# Patient Record
Sex: Male | Born: 1995 | Race: Black or African American | Hispanic: No | Marital: Single | Smoking: Current every day smoker
Health system: Southern US, Community
[De-identification: ages and names within clinical notes are randomized; demographics above are authoritative.]

---
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
## 2015-01-02 ENCOUNTER — Telehealth: Payer: Self-pay | Admitting: Family Medicine

## 2015-01-02 NOTE — Telephone Encounter (Signed)
wcc from 2014 printed and faxed. Joi Leyva,CMA

## 2015-01-02 NOTE — Telephone Encounter (Signed)
Mother called and needs us to [facility] Fax [phone]. jw

## 2016-03-16 ENCOUNTER — Emergency Department (HOSPITAL_COMMUNITY): Payer: No Typology Code available for payment source

## 2016-03-16 ENCOUNTER — Emergency Department (HOSPITAL_COMMUNITY)
Admission: EM | Admit: 2016-03-16 | Discharge: 2016-03-17 | Disposition: A | Discharge status: Home / Self Care | Payer: No Typology Code available for payment source | Attending: Emergency Medicine | Admitting: Emergency Medicine

## 2016-03-16 DIAGNOSIS — M25562 Pain in left knee: Secondary | ICD-10-CM | POA: Diagnosis not present

## 2016-03-16 DIAGNOSIS — Y92410 Unspecified street and highway as the place of occurrence of the external cause: Secondary | ICD-10-CM | POA: Insufficient documentation

## 2016-03-16 DIAGNOSIS — Y939 Activity, unspecified: Secondary | ICD-10-CM | POA: Diagnosis not present

## 2016-03-16 DIAGNOSIS — Y999 Unspecified external cause status: Secondary | ICD-10-CM | POA: Diagnosis not present

## 2016-03-16 NOTE — Discharge Instructions (Signed)
Take anti-inflammatory medicines (Ibuprofen) for the next week as needed for pain. Take this medicine with food. Return for worsening symptoms
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
## 2016-03-26 ENCOUNTER — Encounter (HOSPITAL_COMMUNITY): Payer: Self-pay | Admitting: Emergency Medicine

## 2016-03-26 ENCOUNTER — Ambulatory Visit (HOSPITAL_COMMUNITY)
Admission: EM | Admit: 2016-03-26 | Discharge: 2016-03-26 | Disposition: A | Discharge status: Home / Self Care | Payer: Self-pay | Attending: Family Medicine | Admitting: Family Medicine

## 2016-03-26 DIAGNOSIS — S8002XA Contusion of left knee, initial encounter: Secondary | ICD-10-CM

## 2016-03-26 MED ORDER — PREDNISONE 20 MG PO TABS: ORAL_TABLET | ORAL | 0 refills | Status: DC

## 2016-03-26 NOTE — Discharge Instructions (Signed)
.  You need to rest the knee today and this weekend, but return to work on Monday.
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
## 2016-03-30 ENCOUNTER — Encounter (HOSPITAL_COMMUNITY): Payer: Self-pay | Admitting: *Deleted

## 2016-03-30 ENCOUNTER — Ambulatory Visit (HOSPITAL_COMMUNITY)
Admission: EM | Admit: 2016-03-30 | Discharge: 2016-03-30 | Disposition: A | Discharge status: Home / Self Care | Payer: Self-pay | Attending: Family Medicine | Admitting: Family Medicine

## 2016-03-30 DIAGNOSIS — M25562 Pain in left knee: Secondary | ICD-10-CM

## 2016-03-30 MED ORDER — NAPROXEN 500 MG PO TABS: 500.0000 mg | ORAL_TABLET | Freq: Two times a day (BID) | ORAL | 0 refills | Status: DC
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
## 2016-04-01 ENCOUNTER — Ambulatory Visit (INDEPENDENT_AMBULATORY_CARE_PROVIDER_SITE_OTHER): Payer: Self-pay | Admitting: Physician Assistant

## 2016-04-01 DIAGNOSIS — M25562 Pain in left knee: Secondary | ICD-10-CM
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
## 2016-08-03 ENCOUNTER — Ambulatory Visit (HOSPITAL_COMMUNITY)
Admission: EM | Admit: 2016-08-03 | Discharge: 2016-08-03 | Disposition: A | Discharge status: Home / Self Care | Payer: Self-pay | Attending: Family Medicine | Admitting: Family Medicine

## 2016-08-03 ENCOUNTER — Encounter (HOSPITAL_COMMUNITY): Payer: Self-pay | Admitting: Emergency Medicine

## 2016-08-03 DIAGNOSIS — G43001 Migraine without aura, not intractable, with status migrainosus: Secondary | ICD-10-CM

## 2016-08-03 MED ORDER — BUTALBITAL-APAP-CAFFEINE 50-325-40 MG PO TABS: 1.0000 | ORAL_TABLET | Freq: Four times a day (QID) | ORAL | 0 refills | Status: AC | PRN
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
## 2016-08-03 NOTE — Discharge Instructions (Signed)
Drink plenty of fluids.  Return if headache worsens

## 2016-08-03 NOTE — ED Triage Notes (Signed)
[facility] a [facility].

## 2017-01-20 ENCOUNTER — Other Ambulatory Visit: Payer: Self-pay

## 2017-01-20 ENCOUNTER — Emergency Department (HOSPITAL_COMMUNITY)
Admission: EM | Admit: 2017-01-20 | Discharge: 2017-01-20 | Disposition: A | Discharge status: Home / Self Care | Payer: Medicaid Other | Attending: Emergency Medicine | Admitting: Emergency Medicine

## 2017-01-20 ENCOUNTER — Emergency Department (HOSPITAL_COMMUNITY): Payer: Medicaid Other

## 2017-01-20 ENCOUNTER — Encounter (HOSPITAL_COMMUNITY): Payer: Self-pay

## 2017-01-20 DIAGNOSIS — Y999 Unspecified external cause status: Secondary | ICD-10-CM | POA: Insufficient documentation

## 2017-01-20 DIAGNOSIS — Y9389 Activity, other specified: Secondary | ICD-10-CM | POA: Insufficient documentation

## 2017-01-20 DIAGNOSIS — W19XXXA Unspecified fall, initial encounter: Secondary | ICD-10-CM | POA: Insufficient documentation

## 2017-01-20 DIAGNOSIS — Y929 Unspecified place or not applicable: Secondary | ICD-10-CM | POA: Insufficient documentation

## 2017-01-20 DIAGNOSIS — F17210 Nicotine dependence, cigarettes, uncomplicated: Secondary | ICD-10-CM | POA: Insufficient documentation

## 2017-01-20 DIAGNOSIS — S6991XA Unspecified injury of right wrist, hand and finger(s), initial encounter: Secondary | ICD-10-CM | POA: Insufficient documentation
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
## 2017-01-20 NOTE — ED Triage Notes (Addendum)
Patient reports at approx 1500, he tripped and attempted to catch himself on the frame of a door. Patient reports the ring finger on his right hand got bent "the wrong way" and is currently "throbbing." Patient denies LOC or head injury. Patient able to move his right ring finger with full ROM. Patient reports taking 400mg  Ibuprofen for the pain at approx 1530.

## 2017-01-20 NOTE — Discharge Instructions (Signed)
We are splinting the finger for comfort and to keep it in good alignment. You may have injured the extensor tendon. It is important that you call Dr. Merrilee SeashoreKuzma's office tomorrow to schedule a follow up appointment for next week. Return here sooner for any problems.

## 2017-06-16 ENCOUNTER — Other Ambulatory Visit: Payer: Self-pay

## 2017-06-16 ENCOUNTER — Emergency Department (HOSPITAL_COMMUNITY): Payer: Worker's Compensation

## 2017-06-16 ENCOUNTER — Emergency Department (HOSPITAL_COMMUNITY)
Admission: EM | Admit: 2017-06-16 | Discharge: 2017-06-16 | Disposition: A | Discharge status: Home / Self Care | Payer: Worker's Compensation | Attending: Emergency Medicine | Admitting: Emergency Medicine

## 2017-06-16 ENCOUNTER — Encounter (HOSPITAL_COMMUNITY): Payer: Self-pay

## 2017-06-16 DIAGNOSIS — S6991XA Unspecified injury of right wrist, hand and finger(s), initial encounter: Secondary | ICD-10-CM | POA: Diagnosis present

## 2017-06-16 DIAGNOSIS — Y929 Unspecified place or not applicable: Secondary | ICD-10-CM | POA: Insufficient documentation

## 2017-06-16 DIAGNOSIS — F17210 Nicotine dependence, cigarettes, uncomplicated: Secondary | ICD-10-CM | POA: Insufficient documentation

## 2017-06-16 DIAGNOSIS — W208XXA Other cause of strike by thrown, projected or falling object, initial encounter: Secondary | ICD-10-CM | POA: Diagnosis not present

## 2017-06-16 DIAGNOSIS — Y990 Civilian activity done for income or pay: Secondary | ICD-10-CM | POA: Diagnosis not present

## 2017-06-16 DIAGNOSIS — S60221A Contusion of right hand, initial encounter: Secondary | ICD-10-CM | POA: Diagnosis not present

## 2017-06-16 DIAGNOSIS — Y9389 Activity, other specified: Secondary | ICD-10-CM | POA: Diagnosis not present

## 2017-06-16 MED ORDER — NAPROXEN 500 MG PO TABS: 500.0000 mg | ORAL_TABLET | Freq: Two times a day (BID) | ORAL | 0 refills | Status: AC

## 2017-06-16 MED ORDER — KETOROLAC TROMETHAMINE 30 MG/ML IJ SOLN
30.0000 mg | Freq: Once | INTRAMUSCULAR | Status: AC
  Administered 2017-06-16: 30 mg via INTRAMUSCULAR
  Filled 2017-06-16: qty 1
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
## 2017-06-16 NOTE — ED Triage Notes (Signed)
Patient presents with workmans compensation injury. Patient reports he was lifting an approx 200lb dresser with a colleague, and the dresser dropped and landed on his right hand. Patient reports numbness /tingling to right middle finger, pointer finger, and thumb, radiating down his right wrist, with no sensation of touch. Patient reports normal tingling to his left ring finger and pinky, but reports sensation to these fingers. Patient's capillary refill to all right handed fingers <3seconds. Palpable pulses to right radial wrist noted. Patient denies pain at this time, but cannot move his right handed fingers.

## 2017-06-28 ENCOUNTER — Other Ambulatory Visit: Payer: Self-pay | Admitting: Orthopedic Surgery

## 2017-06-28 DIAGNOSIS — S62001A Unspecified fracture of navicular [scaphoid] bone of right wrist, initial encounter for closed fracture: Secondary | ICD-10-CM

## 2017-12-23 ENCOUNTER — Other Ambulatory Visit: Payer: Self-pay

## 2017-12-23 ENCOUNTER — Encounter (HOSPITAL_COMMUNITY): Payer: Self-pay | Admitting: Emergency Medicine

## 2017-12-23 ENCOUNTER — Emergency Department (HOSPITAL_COMMUNITY)
Admission: EM | Admit: 2017-12-23 | Discharge: 2017-12-23 | Disposition: A | Discharge status: Home / Self Care | Payer: Worker's Compensation | Attending: Emergency Medicine | Admitting: Emergency Medicine

## 2017-12-23 DIAGNOSIS — M79605 Pain in left leg: Secondary | ICD-10-CM

## 2017-12-23 DIAGNOSIS — F17210 Nicotine dependence, cigarettes, uncomplicated: Secondary | ICD-10-CM | POA: Diagnosis not present

## 2017-12-23 DIAGNOSIS — M79662 Pain in left lower leg: Secondary | ICD-10-CM | POA: Diagnosis present

## 2017-12-23 NOTE — ED Notes (Signed)
ED Provider at bedside. 

## 2017-12-23 NOTE — ED Notes (Signed)
Ortho tech made aware of crutches order

## 2017-12-23 NOTE — Discharge Instructions (Signed)
Use the crutches so that you do not have to weight-bear on your left leg for the next 3 days.  Place an ice pack on the painful area 30 minutes at a time 4 times daily.  It is okay to take Tylenol for pain.  Call Dr. Luiz Blare if you have significant pain or difficulty walking by Monday, December 26, 2017
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
## 2017-12-23 NOTE — ED Notes (Signed)
Pt was able to get himself out of wheelchair and walk into room from hallway without any assistance and steady gait.

## 2017-12-23 NOTE — ED Triage Notes (Addendum)
Patient here from home with complaints of left leg pain from knee to ankle that started last night after moving furniture. Ambulatory.

## 2018-11-29 IMAGING — CR DG HAND COMPLETE 3+V*R*
3 series · 3 of 3 positions shown · non-contrast
Comparison: None.

CLINICAL DATA: Right ring finger pain after hand was bent backward
on a door jam.

EXAM:
RIGHT HAND - COMPLETE 3+ VIEW

[x hand pa right]
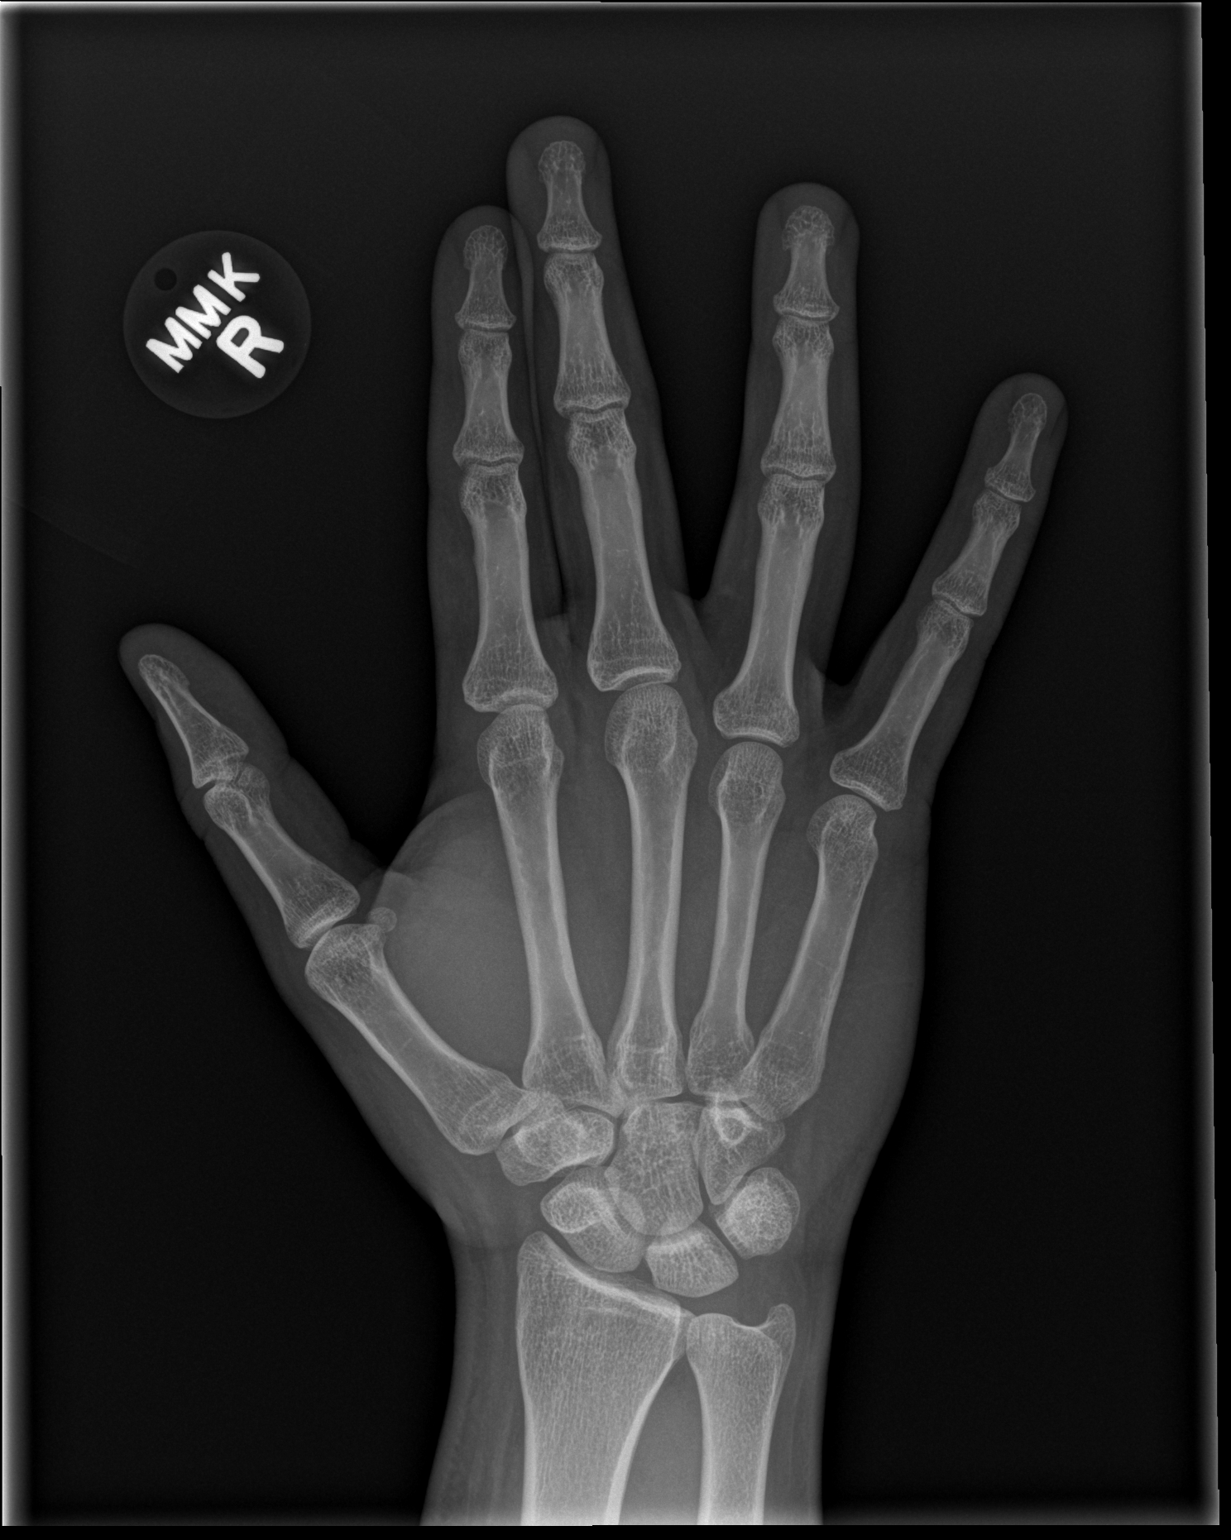

[x hand obl right]
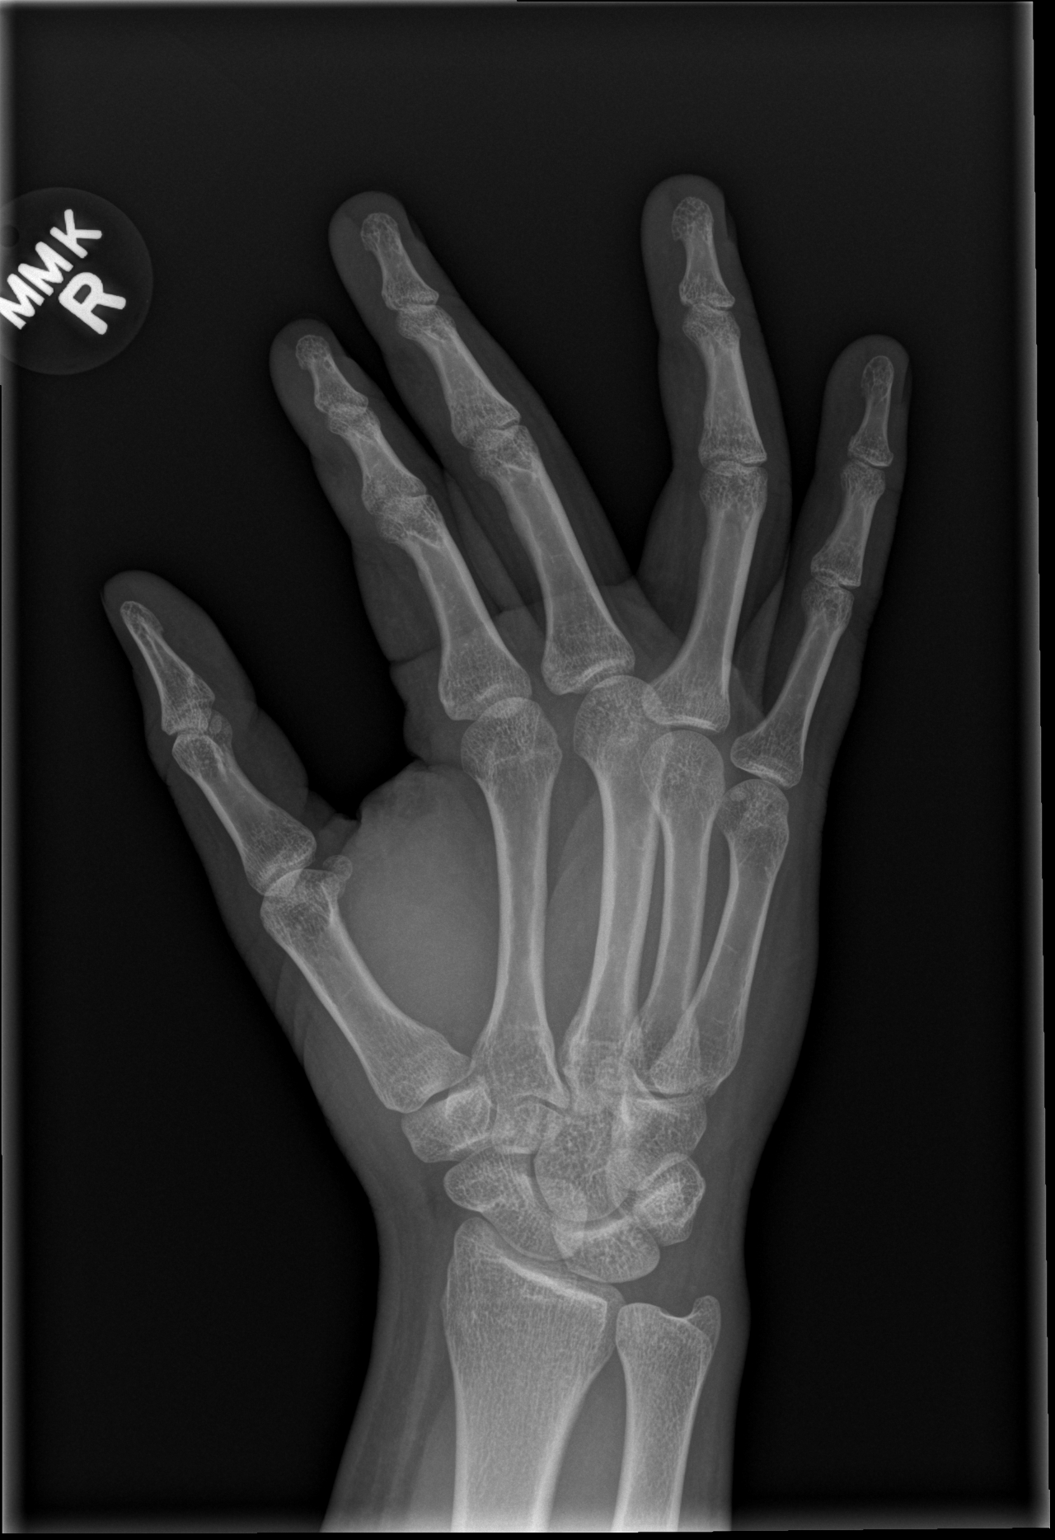

[x hand lat right]
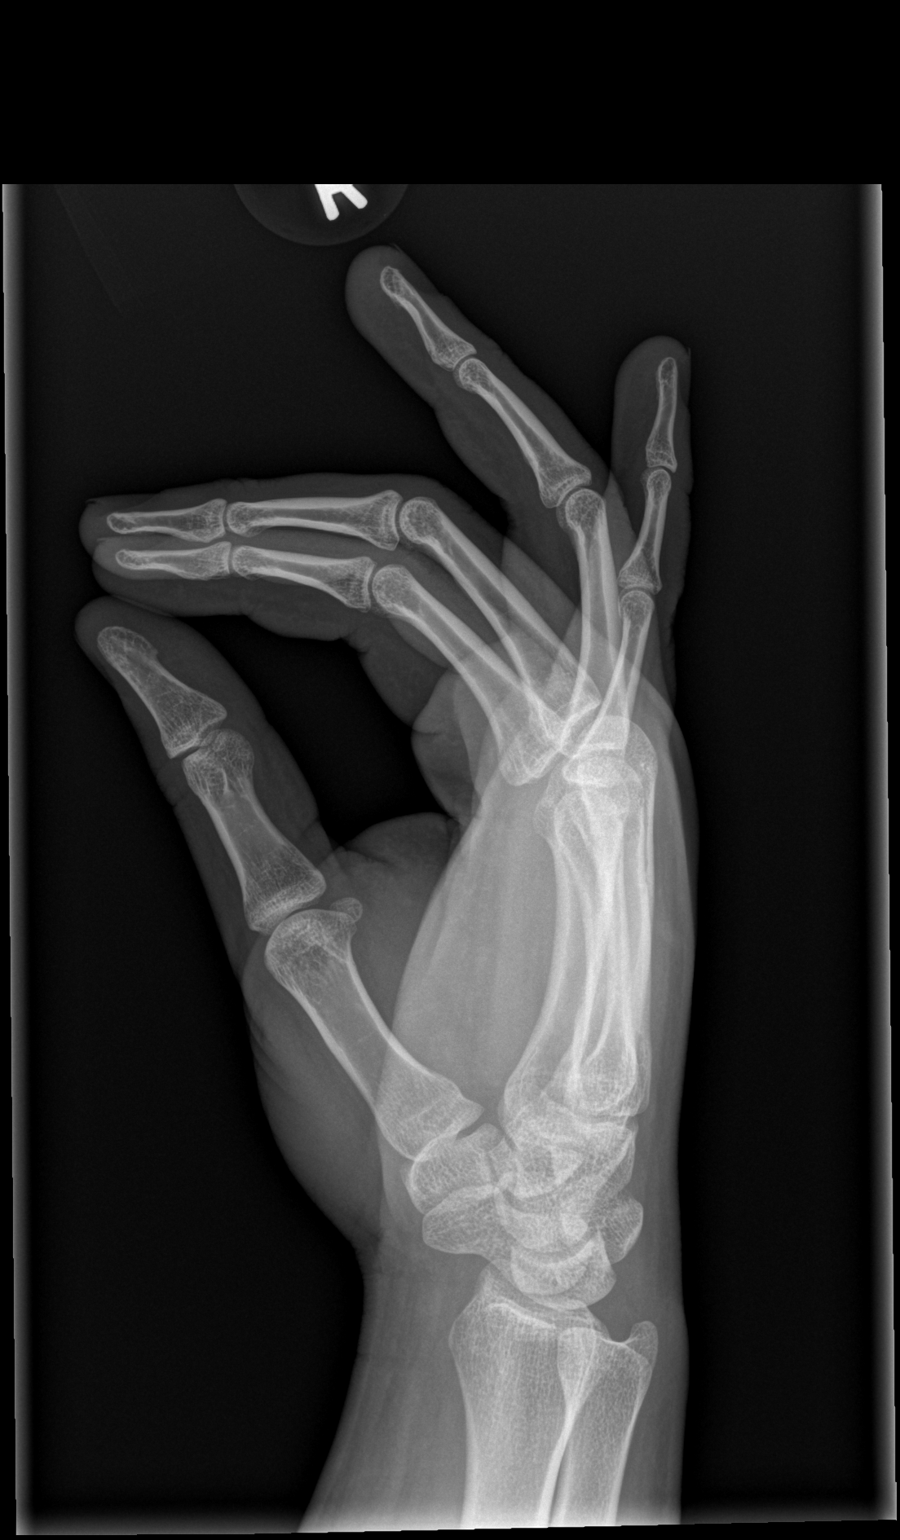

[3 of 3 positions shown; findings below may reference images not displayed]

FINDINGS: There is no evidence of fracture or dislocation. There is no
evidence of arthropathy or other focal bone abnormality. Soft
tissues are unremarkable.
IMPRESSION: No acute fracture dislocation of the right hand and wrist.

## 2019-04-25 IMAGING — CR DG WRIST COMPLETE 3+V*R*
4 series · 4 of 4 positions shown · non-contrast
Comparison: None.

CLINICAL DATA: Crush injury to right hand.

EXAM:
RIGHT HAND - COMPLETE 3+ VIEW; RIGHT WRIST - COMPLETE 3+ VIEW

[x wrist pa right]
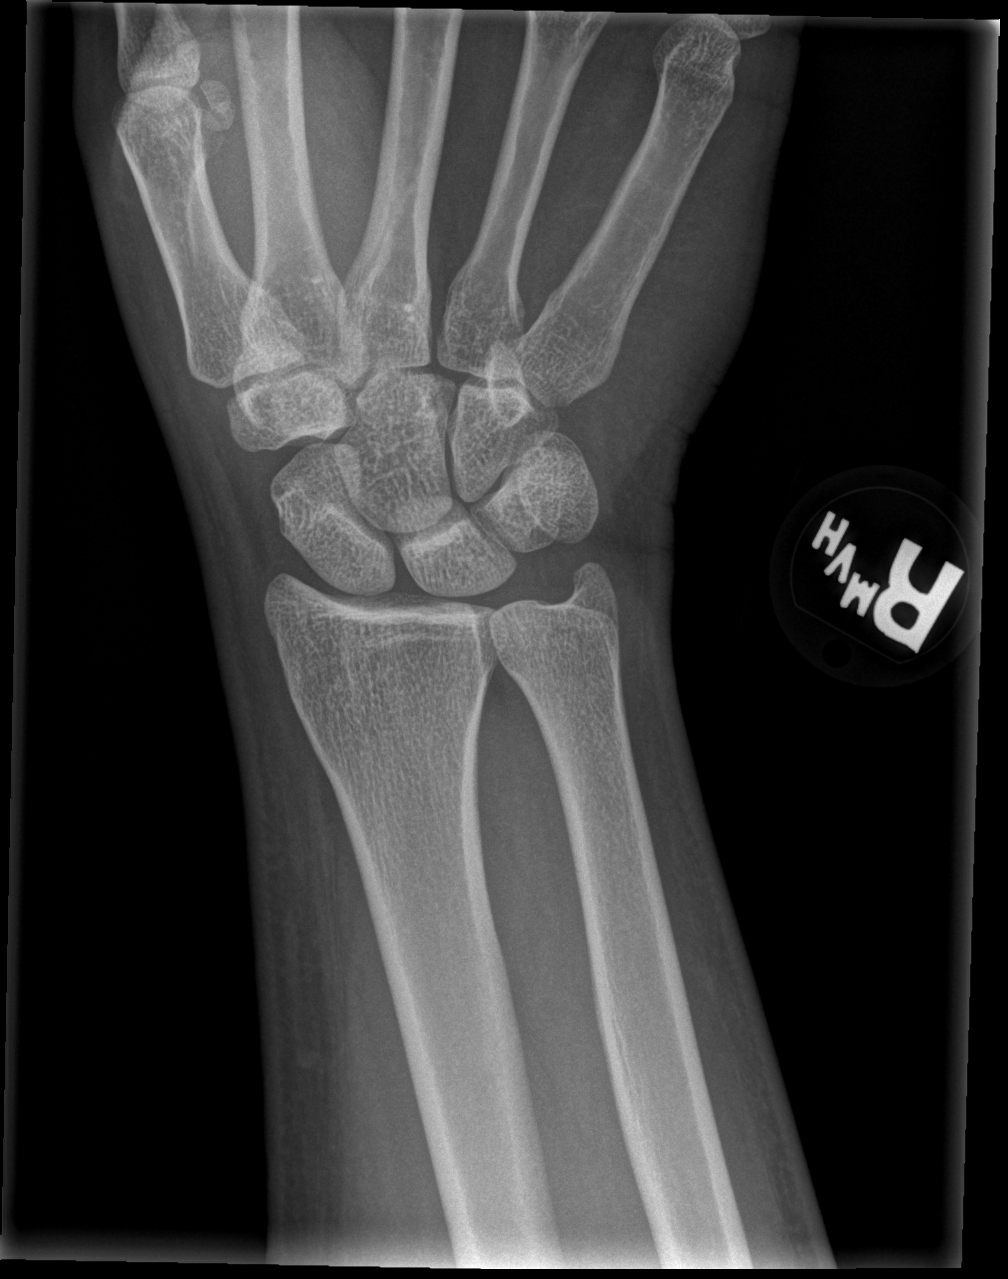

[x wrist obl right]
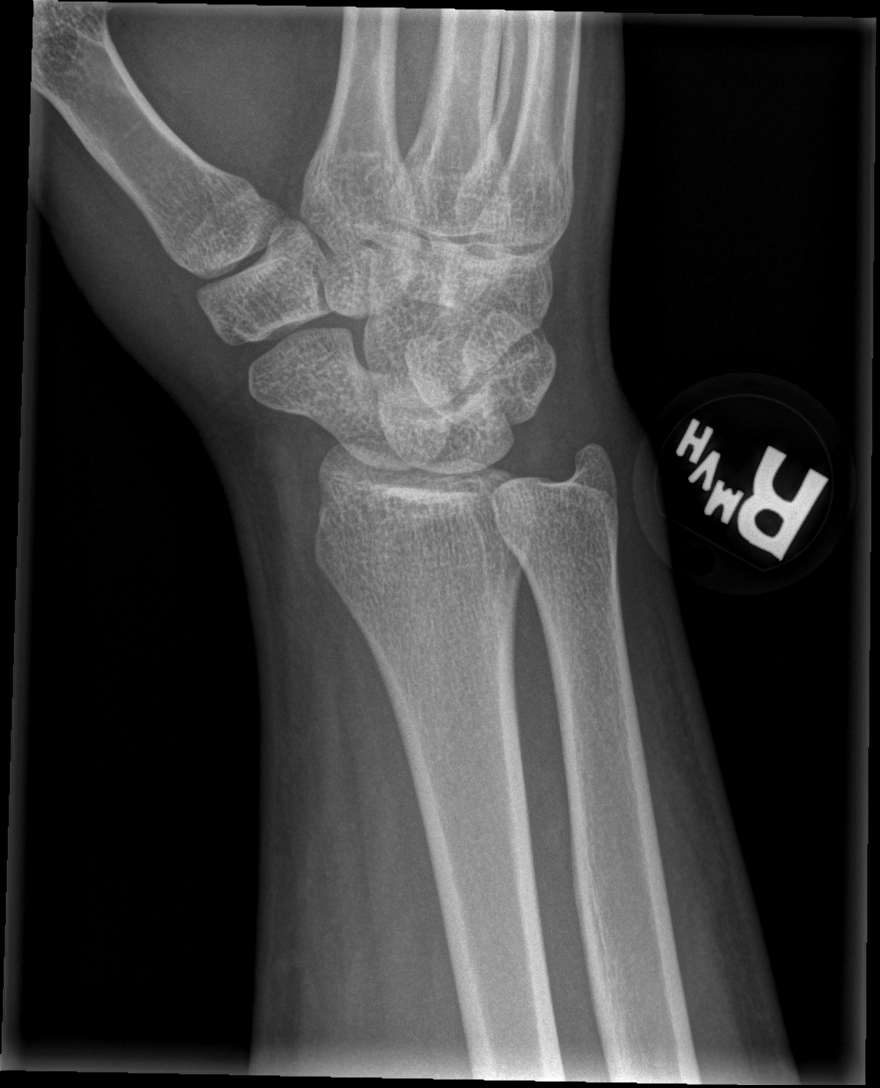

[x wrist lat right]
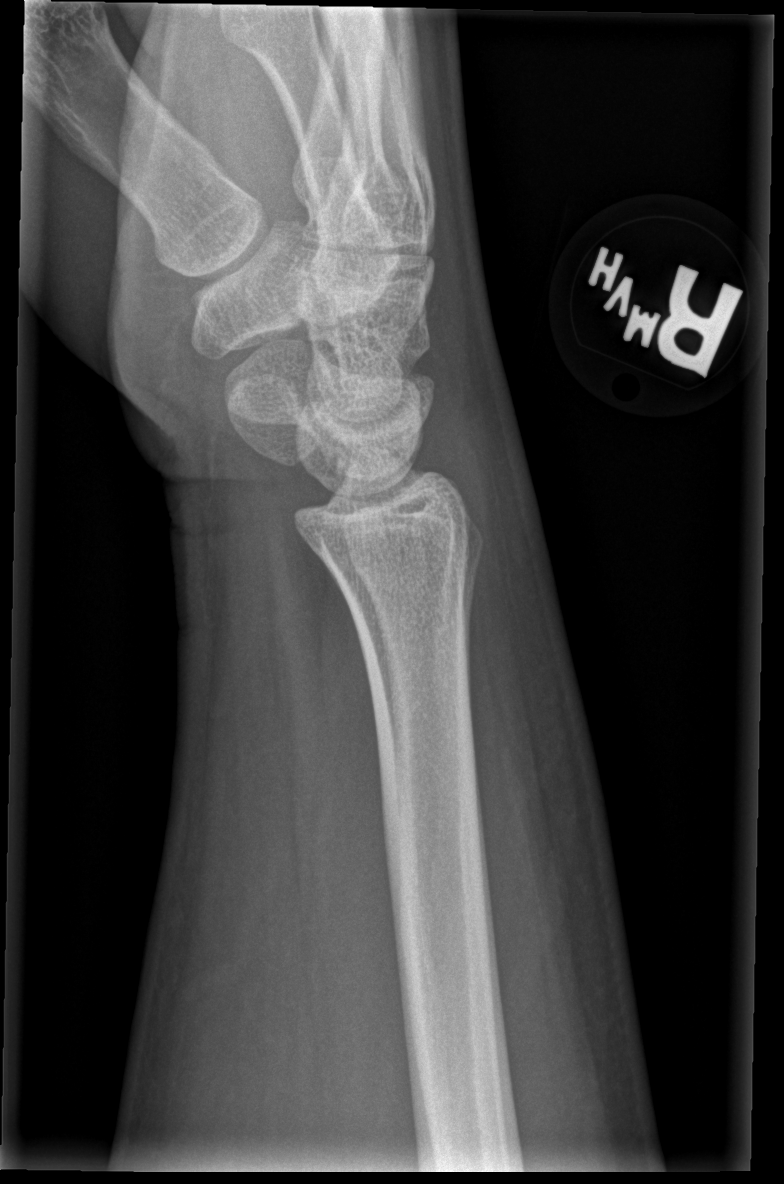

[x wrist navicular view right]
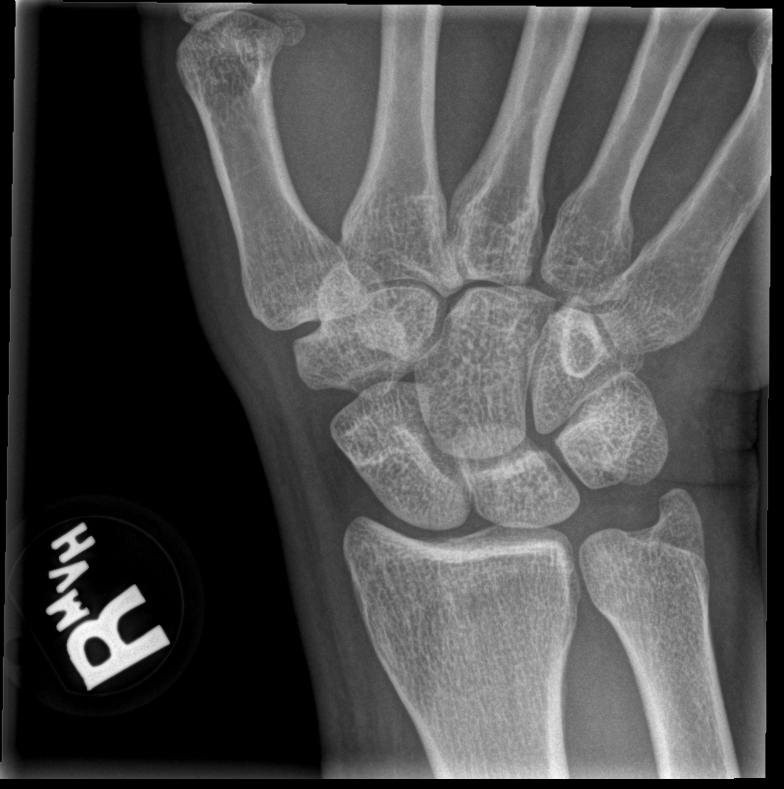

[4 of 4 positions shown; findings below may reference images not displayed]

FINDINGS: There is no evidence of fracture or dislocation. There is no
evidence of arthropathy or other focal bone abnormality. Soft
tissues are unremarkable.
IMPRESSION: Negative.
